# Patient Record
Sex: Male | Born: 1995 | Race: White | Hispanic: No | Marital: Single | Smoking: Never smoker
Health system: Southern US, Community
[De-identification: ages and names within clinical notes are randomized; demographics above are authoritative.]

## PROBLEM LIST (undated history)

## (undated) DIAGNOSIS — S43006A Unspecified dislocation of unspecified shoulder joint, initial encounter: Secondary | ICD-10-CM

## (undated) HISTORY — PX: TONSILLECTOMY: SUR1361

---
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
## 2014-08-14 IMAGING — CR DG CHEST 2V
2 series · 2 of 2 positions shown · non-contrast
Comparison: None.

CLINICAL DATA: Left anterior chest pain for 3 days

EXAM:
CHEST  2 VIEW

[w chest pa]
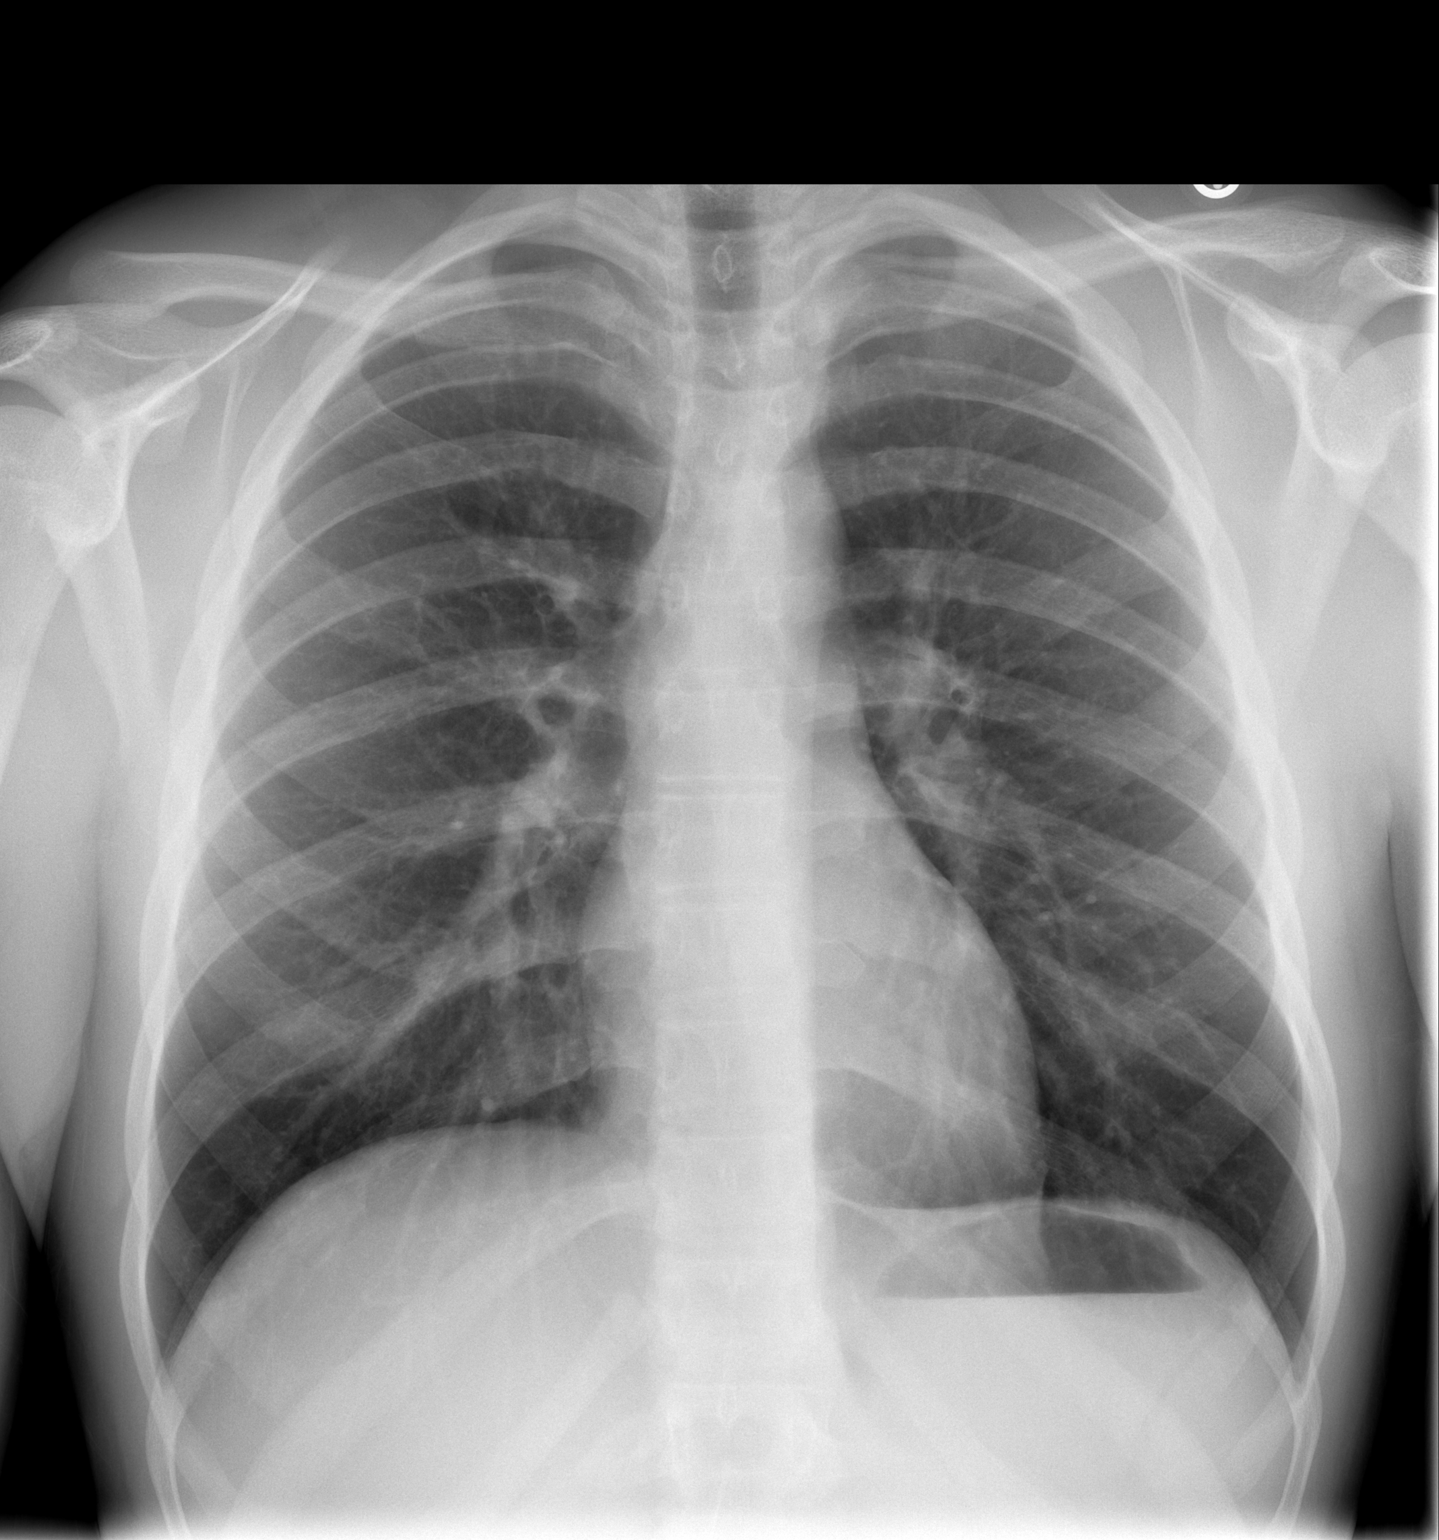

[w chest lat]
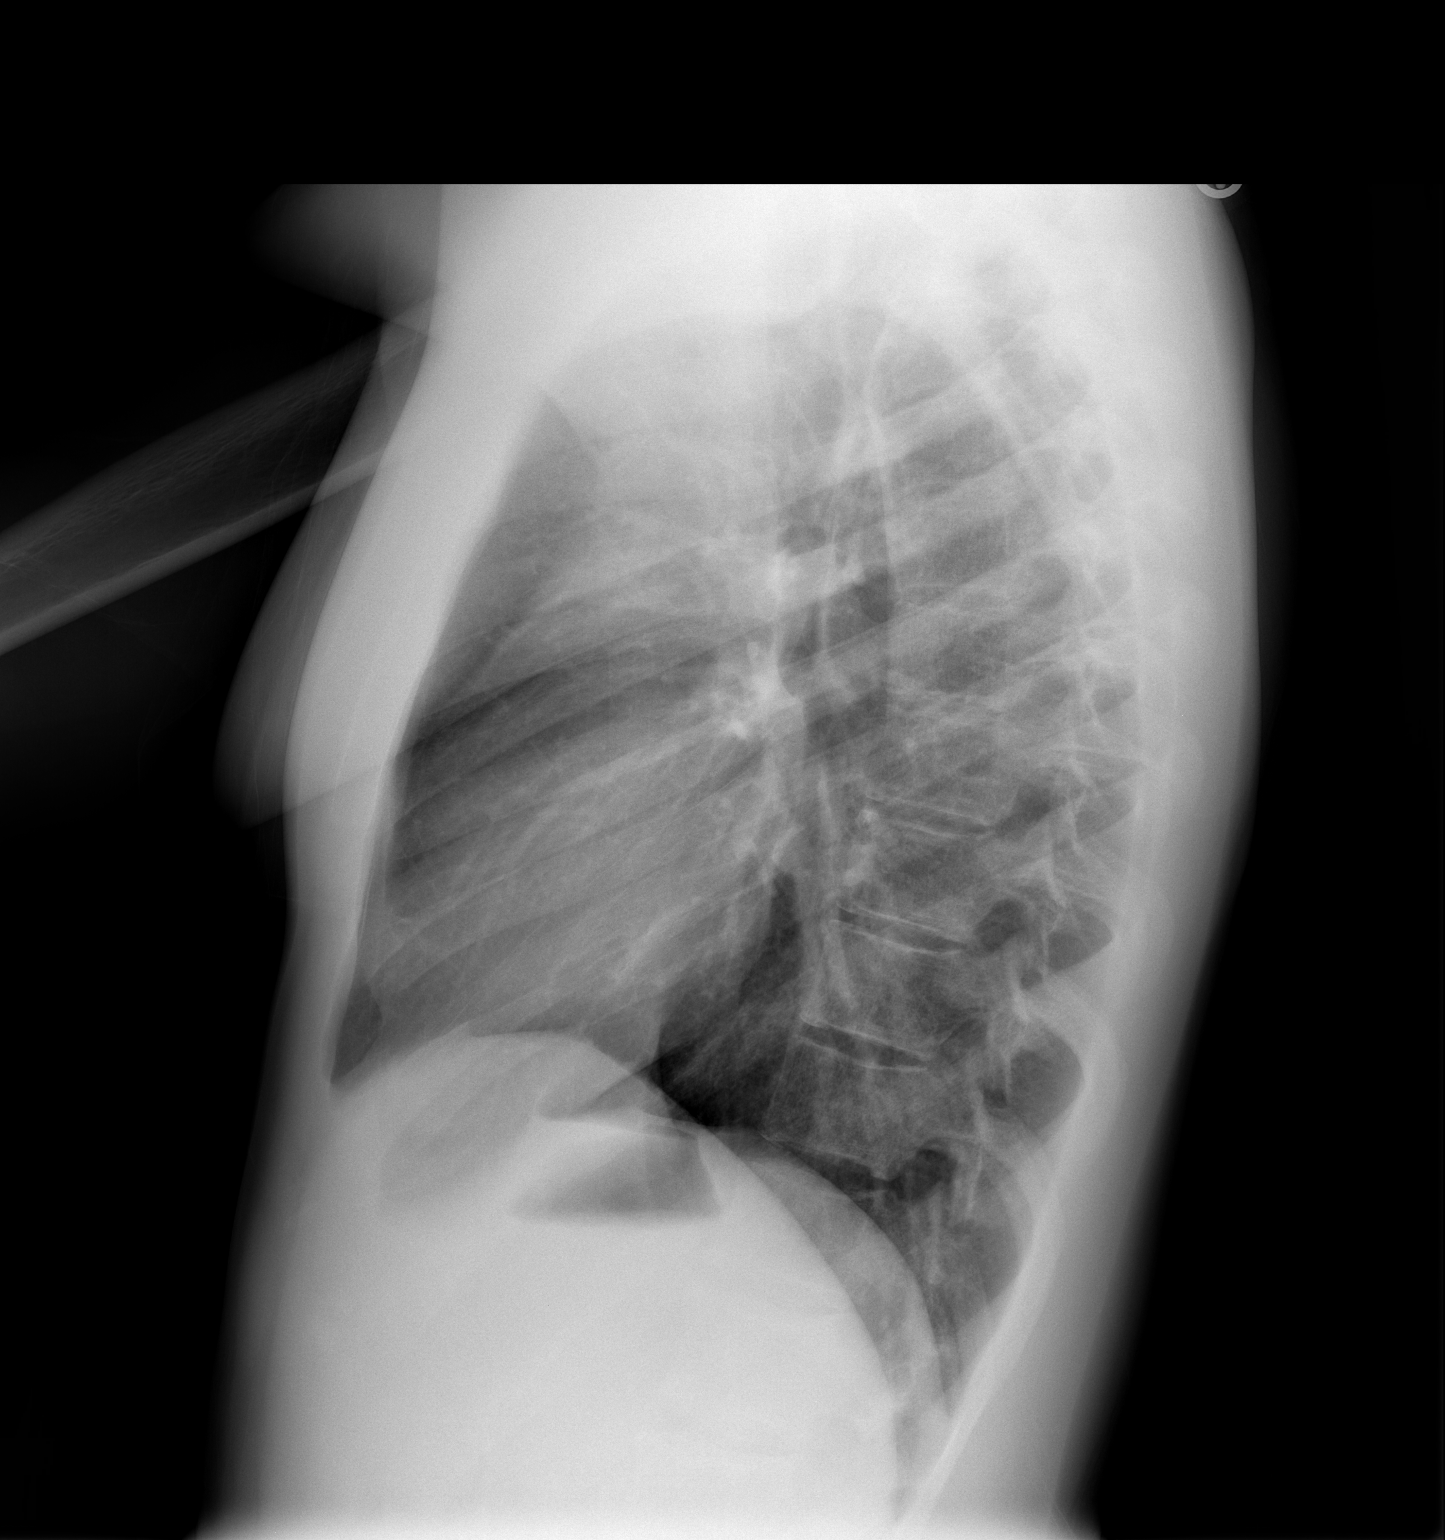

[2 of 2 positions shown; findings below may reference images not displayed]

FINDINGS: The heart size and mediastinal contours are within normal limits.
Both lungs are clear. The visualized skeletal structures are
unremarkable.
IMPRESSION: No active cardiopulmonary disease.
